# Patient Record
Sex: Female | Born: 1965 | Hispanic: No | Marital: Married | Smoking: Never smoker
Health system: Southern US, Community
[De-identification: ages and names within clinical notes are randomized; demographics above are authoritative.]

## PROBLEM LIST (undated history)

## (undated) HISTORY — PX: TONSILLECTOMY AND ADENOIDECTOMY: SHX28

---

## 1996-12-29 HISTORY — PX: NASAL SEPTUM SURGERY: SHX37

## 1998-12-06 ENCOUNTER — Other Ambulatory Visit: Admission: RE | Admit: 1998-12-06 | Discharge: 1998-12-06 | Payer: Self-pay | Admitting: Obstetrics and Gynecology

## 2000-01-01 ENCOUNTER — Other Ambulatory Visit: Admission: RE | Admit: 2000-01-01 | Discharge: 2000-01-01 | Payer: Self-pay | Admitting: Obstetrics and Gynecology

## 2001-03-08 ENCOUNTER — Other Ambulatory Visit: Admission: RE | Admit: 2001-03-08 | Discharge: 2001-03-08 | Payer: Self-pay | Admitting: Obstetrics and Gynecology

## 2002-03-15 ENCOUNTER — Other Ambulatory Visit: Admission: RE | Admit: 2002-03-15 | Discharge: 2002-03-15 | Payer: Self-pay | Admitting: *Deleted

## 2003-08-15 ENCOUNTER — Other Ambulatory Visit: Admission: RE | Admit: 2003-08-15 | Discharge: 2003-08-15 | Payer: Self-pay | Admitting: Obstetrics and Gynecology

## 2004-05-01 ENCOUNTER — Encounter: Admission: RE | Admit: 2004-05-01 | Discharge: 2004-05-01 | Payer: Self-pay | Admitting: Obstetrics and Gynecology

## 2005-08-26 ENCOUNTER — Other Ambulatory Visit: Admission: RE | Admit: 2005-08-26 | Discharge: 2005-08-26 | Payer: Self-pay | Admitting: Obstetrics and Gynecology

## 2005-09-08 ENCOUNTER — Encounter: Admission: RE | Admit: 2005-09-08 | Discharge: 2005-09-08 | Payer: Self-pay | Admitting: Obstetrics and Gynecology

## 2007-02-18 ENCOUNTER — Other Ambulatory Visit: Admission: RE | Admit: 2007-02-18 | Discharge: 2007-02-18 | Payer: Self-pay | Admitting: Obstetrics & Gynecology

## 2008-02-21 ENCOUNTER — Other Ambulatory Visit: Admission: RE | Admit: 2008-02-21 | Discharge: 2008-02-21 | Payer: Self-pay | Admitting: Obstetrics & Gynecology

## 2009-02-22 ENCOUNTER — Other Ambulatory Visit: Admission: RE | Admit: 2009-02-22 | Discharge: 2009-02-22 | Payer: Self-pay | Admitting: Obstetrics and Gynecology

## 2011-06-13 ENCOUNTER — Other Ambulatory Visit: Payer: Self-pay | Admitting: Family Medicine

## 2011-06-13 ENCOUNTER — Ambulatory Visit
Admission: RE | Admit: 2011-06-13 | Discharge: 2011-06-13 | Disposition: A | Discharge status: Home / Self Care | Payer: BC Managed Care – PPO | Source: Ambulatory Visit | Attending: Family Medicine | Admitting: Family Medicine

## 2011-06-13 DIAGNOSIS — S139XXA Sprain of joints and ligaments of unspecified parts of neck, initial encounter: Secondary | ICD-10-CM

## 2013-11-16 ENCOUNTER — Ambulatory Visit
Admission: RE | Admit: 2013-11-16 | Discharge: 2013-11-16 | Disposition: A | Discharge status: Home / Self Care | Payer: BC Managed Care – PPO | Source: Ambulatory Visit | Attending: Family Medicine | Admitting: Family Medicine

## 2013-11-16 ENCOUNTER — Other Ambulatory Visit: Payer: Self-pay | Admitting: Family Medicine

## 2013-11-16 DIAGNOSIS — M79609 Pain in unspecified limb: Secondary | ICD-10-CM

## 2013-12-19 ENCOUNTER — Ambulatory Visit: Payer: Self-pay | Admitting: Podiatry

## 2014-01-09 ENCOUNTER — Ambulatory Visit: Payer: Self-pay | Admitting: Podiatry

## 2014-01-12 ENCOUNTER — Ambulatory Visit: Payer: Self-pay | Admitting: Podiatry

## 2014-03-02 ENCOUNTER — Ambulatory Visit: Payer: Self-pay | Admitting: Nurse Practitioner

## 2015-02-06 ENCOUNTER — Encounter: Payer: Self-pay | Admitting: Nurse Practitioner

## 2015-02-06 ENCOUNTER — Ambulatory Visit (INDEPENDENT_AMBULATORY_CARE_PROVIDER_SITE_OTHER): Payer: BLUE CROSS/BLUE SHIELD | Admitting: Nurse Practitioner

## 2015-02-06 VITALS — BP 114/76 | HR 80 | Ht 63.0 in | Wt 162.0 lb

## 2015-02-06 DIAGNOSIS — Z0000 Encounter for general adult medical examination without abnormal findings: Secondary | ICD-10-CM

## 2015-02-06 DIAGNOSIS — N912 Amenorrhea, unspecified: Secondary | ICD-10-CM

## 2015-02-06 DIAGNOSIS — Z01419 Encounter for gynecological examination (general) (routine) without abnormal findings: Secondary | ICD-10-CM

## 2015-02-06 LAB — LIPID PANEL
CHOL/HDL RATIO: 2.7 ratio
CHOLESTEROL: 179 mg/dL (ref 0–200)
HDL: 66 mg/dL (ref >39)
LDL Cholesterol: 99 mg/dL (ref 0–99)
Triglycerides: 70 mg/dL (ref <150)
VLDL: 14 mg/dL (ref 0–40)

## 2015-02-06 LAB — POCT URINALYSIS DIPSTICK
BILIRUBIN UA: NEGATIVE
Blood, UA: NEGATIVE
Glucose, UA: NEGATIVE
KETONES UA: NEGATIVE
LEUKOCYTES UA: NEGATIVE
Nitrite, UA: NEGATIVE
Protein, UA: NEGATIVE
Urobilinogen, UA: NEGATIVE
pH, UA: 7

## 2015-02-06 LAB — COMPREHENSIVE METABOLIC PANEL
ALT: 44 U/L — AB (ref 0–35)
AST: 31 U/L (ref 0–37)
Albumin: 4.4 g/dL (ref 3.5–5.2)
Alkaline Phosphatase: 106 U/L (ref 39–117)
BILIRUBIN TOTAL: 0.4 mg/dL (ref 0.2–1.2)
BUN: 10 mg/dL (ref 6–23)
CO2: 26 mEq/L (ref 19–32)
CREATININE: 0.65 mg/dL (ref 0.50–1.10)
Calcium: 9.6 mg/dL (ref 8.4–10.5)
Chloride: 105 mEq/L (ref 96–112)
Glucose, Bld: 96 mg/dL (ref 70–99)
Potassium: 4.1 mEq/L (ref 3.5–5.3)
SODIUM: 137 meq/L (ref 135–145)
Total Protein: 7.1 g/dL (ref 6.0–8.3)

## 2015-02-06 LAB — HEMOGLOBIN A1C
HEMOGLOBIN A1C: 5.7 % — AB (ref <5.7)
Mean Plasma Glucose: 117 mg/dL — ABNORMAL HIGH (ref <117)

## 2015-02-06 LAB — HEMOGLOBIN, FINGERSTICK: Hemoglobin, fingerstick: 13.8 g/dL (ref 12.0–16.0)

## 2015-02-06 MED ORDER — MEDROXYPROGESTERONE ACETATE 10 MG PO TABS: 10.0000 mg | ORAL_TABLET | Freq: Every day | ORAL | Status: DC

## 2015-02-06 NOTE — Progress Notes (Signed)
Patient ID: Michele Walsh, female   DOB: 02/10/1966, 49 y.o.   MRN: [id number] 49 y.o. G0 Married  Caucasian Fe here for annual exam. Amenorrhea since 08/26/14.  States she had 2 cycles in August 2015 about 20 days apart.  Each lasted 3 days flow was ? Moderate.  Since then some breast tenderness.  No vaginal dryness.  Some decrease in libido.   Prior to that it had been 4 months since prior menses.  She has had history of irregular menses in 2012 and took Provera challenge and did return to somewhat regular menses until now.    Increase in vaso symptoms that have been worse in past 6 months. Biggest problem has been with Insomnia with night sweats. Mood changes, 'out of control'.  She is glad not to be having a cycle.  She is interested in HRT - but prefers bio -identical HRT.  Patient's last menstrual period was 08/26/2014 (exact date).          Sexually active: Yes.    The current method of family planning is rhythm method.    Exercising: No.  The patient does not participate in regular exercise at present. Smoker:  no  Health Maintenance: Pap:  02/24/13, negative with neg HR HPV, no history of abnormal MMG:  02/05/13, Bi-Rads 1:  Negative  TDaP:  UTD with PCP Labs:  HB:  13.8  Urine:  Negative    reports that she has never smoked. She has never used smokeless tobacco. She reports that she does not drink alcohol or use illicit drugs.  No past medical history on file.  Past Surgical History  Procedure Laterality Date  . Tonsillectomy and adenoidectomy  childhood  . Nasal septum surgery  1998    Current Outpatient Prescriptions  Medication Sig Dispense Refill  . medroxyPROGESTERone (PROVERA) 10 MG tablet Take 1 tablet (10 mg total) by mouth daily. 10 tablet 0   No current facility-administered medications for this visit.    Family History  Problem Relation Age of Onset  . Hyperlipidemia Mother   . Hypertension Mother   . Heart disease Mother   . Hypertension Father   . Hyperlipidemia  Father   . Heart disease Father   . Hypertension Sister   . Heart disease Maternal Grandmother   . Heart attack Maternal Grandmother   . Alzheimer's disease Paternal Grandmother   . Heart disease Paternal Grandfather   . Other Sister     blood disorder that will eventually turn into lupus    ROS:  Pertinent items are noted in HPI.  Otherwise, a comprehensive ROS was negative.  Exam:   BP 114/76 mmHg  Pulse 80  Ht 5\' 3"  (1.6 m)  Wt 162 lb (73.483 kg)  BMI 28.70 kg/m2  LMP 08/26/2014 (Exact Date) Height: 5\' 3"  (160 cm) Ht Readings from Last 3 Encounters:  02/06/15 5\' 3"  (1.6 m)    General appearance: alert, cooperative and appears stated age Head: Normocephalic, without obvious abnormality, atraumatic Neck: no adenopathy, supple, symmetrical, trachea midline and thyroid normal to inspection and palpation Lungs: clear to auscultation bilaterally Breasts: normal appearance, no masses or tenderness Heart: regular rate and rhythm Abdomen: soft, non-tender; no masses,  no organomegaly Extremities: extremities normal, atraumatic, no cyanosis or edema Skin: Skin color, texture, turgor normal. No rashes or lesions Lymph nodes: Cervical, supraclavicular, and axillary nodes normal. No abnormal inguinal nodes palpated Neurologic: Grossly normal   Pelvic: External genitalia:  no lesions  Urethra:  normal appearing urethra with no masses, tenderness or lesions              Bartholin's and Skene's: normal                 Vagina: normal appearing vagina with normal color and discharge, no lesions              Cervix: anteverted              Pap taken: Yes.   Bimanual Exam:  Uterus:  normal size, contour, position, consistency, mobility, non-tender              Adnexa: no mass, fullness, tenderness               Rectovaginal: Confirms               Anus:  normal sphincter tone, no lesions  Chaperone present:  yes  A:  Well Woman with normal exam  Perimenopausal C/W  irregular menses  Amenorrhea since 07/2014  Increase in vaso symptoms  P:   Reviewed health and wellness pertinent to exam  Pap smear taken today  Mammogram is due now and will schedule  Discussion about amenorrhea and hormonal changes  She agrees to Provera challenge again 10 mg X 10 days and will CB with +/- results  Discussed briefly WHI study and +/- SE of HRT with risk factors.  She has no [facility] of breast cancer, CVA.  Following test results she may decide to seek provider for Bio - identical HRT.  Counseled on breast self exam, mammography screening, adequate intake of calcium and vitamin D, diet and exercise return annually or prn  An After Visit Summary was printed and given to the patient.

## 2015-02-06 NOTE — Patient Instructions (Addendum)

## 2015-02-07 ENCOUNTER — Other Ambulatory Visit: Payer: Self-pay | Admitting: Nurse Practitioner

## 2015-02-07 DIAGNOSIS — R899 Unspecified abnormal finding in specimens from other organs, systems and tissues: Secondary | ICD-10-CM

## 2015-02-07 LAB — FOLLICLE STIMULATING HORMONE: FSH: 87.4 m[IU]/mL

## 2015-02-07 LAB — THYROID PANEL WITH TSH
Free Thyroxine Index: 1.9 (ref 1.4–3.8)
T3 UPTAKE: 27 % (ref 22–35)
T4 TOTAL: 7.1 ug/dL (ref 4.5–12.0)
TSH: 1.967 u[IU]/mL (ref 0.350–4.500)

## 2015-02-07 LAB — PROLACTIN: PROLACTIN: 6 ng/mL

## 2015-02-08 LAB — IPS PAP TEST WITH HPV

## 2015-02-09 NOTE — Progress Notes (Signed)
Reviewed personally.  M. Suzanne Maxen Rowland, MD.  

## 2015-02-19 ENCOUNTER — Other Ambulatory Visit (INDEPENDENT_AMBULATORY_CARE_PROVIDER_SITE_OTHER): Payer: BLUE CROSS/BLUE SHIELD

## 2015-02-19 DIAGNOSIS — R899 Unspecified abnormal finding in specimens from other organs, systems and tissues: Secondary | ICD-10-CM

## 2015-02-19 LAB — HEPATIC FUNCTION PANEL
ALBUMIN: 4.4 g/dL (ref 3.5–5.2)
ALK PHOS: 104 U/L (ref 39–117)
ALT: 38 U/L — ABNORMAL HIGH (ref 0–35)
AST: 28 U/L (ref 0–37)
BILIRUBIN DIRECT: 0.1 mg/dL (ref 0.0–0.3)
BILIRUBIN INDIRECT: 0.2 mg/dL (ref 0.2–1.2)
Total Bilirubin: 0.3 mg/dL (ref 0.2–1.2)
Total Protein: 7.3 g/dL (ref 6.0–8.3)

## 2015-02-20 ENCOUNTER — Telehealth: Payer: Self-pay | Admitting: Nurse Practitioner

## 2015-02-20 NOTE — Telephone Encounter (Signed)
Michele FranklinPatricia Rolen-Grubb, FNP please review and advise results from 2/22.

## 2015-02-20 NOTE — Telephone Encounter (Signed)
Patient is calling to see if her results are in yet. °

## 2015-02-20 NOTE — Telephone Encounter (Signed)
Results were sent via mychart.

## 2016-02-11 ENCOUNTER — Ambulatory Visit: Payer: BLUE CROSS/BLUE SHIELD | Admitting: Nurse Practitioner

## 2016-02-25 ENCOUNTER — Ambulatory Visit: Payer: BLUE CROSS/BLUE SHIELD | Admitting: Nurse Practitioner

## 2016-06-23 ENCOUNTER — Encounter: Payer: Self-pay | Admitting: Nurse Practitioner

## 2016-06-23 ENCOUNTER — Ambulatory Visit (INDEPENDENT_AMBULATORY_CARE_PROVIDER_SITE_OTHER): Payer: 59 | Admitting: Nurse Practitioner

## 2016-06-23 VITALS — BP 120/74 | HR 68 | Ht 62.75 in | Wt 172.0 lb

## 2016-06-23 DIAGNOSIS — Z0000 Encounter for general adult medical examination without abnormal findings: Secondary | ICD-10-CM | POA: Diagnosis not present

## 2016-06-23 DIAGNOSIS — N950 Postmenopausal bleeding: Secondary | ICD-10-CM

## 2016-06-23 DIAGNOSIS — Z01419 Encounter for gynecological examination (general) (routine) without abnormal findings: Secondary | ICD-10-CM | POA: Diagnosis not present

## 2016-06-23 LAB — CBC
HEMATOCRIT: 41.3 % (ref 35.0–45.0)
Hemoglobin: 13.9 g/dL (ref 11.7–15.5)
MCH: 29.5 pg (ref 27.0–33.0)
MCHC: 33.7 g/dL (ref 32.0–36.0)
MCV: 87.7 fL (ref 80.0–100.0)
MPV: 10.4 fL (ref 7.5–12.5)
Platelets: 258 10*3/uL (ref 140–400)
RBC: 4.71 MIL/uL (ref 3.80–5.10)
RDW: 13.7 % (ref 11.0–15.0)
WBC: 7.4 10*3/uL (ref 3.8–10.8)

## 2016-06-23 LAB — POCT URINALYSIS DIPSTICK
BILIRUBIN UA: NEGATIVE
Blood, UA: NEGATIVE
Glucose, UA: NEGATIVE
KETONES UA: NEGATIVE
LEUKOCYTES UA: NEGATIVE
Nitrite, UA: NEGATIVE
PH UA: 5.5
Protein, UA: NEGATIVE
Urobilinogen, UA: NEGATIVE

## 2016-06-23 NOTE — Patient Instructions (Addendum)

## 2016-06-23 NOTE — Progress Notes (Signed)
Patient ID: Michele Walsh, female   DOB: 31-Oct-1966, 50 y.o.   MRN: [id number]  50 y.o. G0P0000 Married  Caucasian Fe here for annual exam.  Light spotting for 2-3 days in 11/2015.  Did not take Provera challenge in 01/2015 as directed.  She would have postmenopausal at 07/2015 but then had another menses 12/16 as noted.  [facility]  She is advised that we should proceed with PUS and possible EMB.  Patient's last menstrual period was 11/29/2015 (approximate).          Sexually active: Yes.    The current method of family planning is peri-menopausal.    Exercising: No.  The patient does not participate in regular exercise at present. Smoker:  no  Health Maintenance: Pap:02/06/15, Negative with neg HR HPV MMG: 06/21/16, no results at time of visit, [facility] TDaP: UTD with PCP HIV: discuss today Labs: HB: 14.2  Urine: Negative    reports that she has never smoked. She has never used smokeless tobacco. She reports that she does not drink alcohol or use illicit drugs.  History reviewed. No pertinent past medical history.  Past Surgical History  Procedure Laterality Date  . Tonsillectomy and adenoidectomy  childhood  . Nasal septum surgery  1998    No current outpatient prescriptions on file.   No current facility-administered medications for this visit.    Family History  Problem Relation Age of Onset  . Hyperlipidemia Mother   . Hypertension Mother   . Heart disease Mother   . Hypertension Father   . Hyperlipidemia Father   . Heart disease Father   . Diabetes Father   . Hypertension Sister   . Heart disease Maternal Grandmother   . Heart attack Maternal Grandmother   . Alzheimer's disease Paternal Grandmother   . Heart disease Paternal Grandfather   . Other Sister     blood disorder that will eventually turn into lupus    ROS:  Pertinent items are noted in HPI.  Otherwise, a comprehensive ROS was negative.  Exam:   BP 120/74 mmHg  Pulse 68  Ht 5' 2.75"  (1.594 m)  Wt 172 lb (78.019 kg)  BMI 30.71 kg/m2  LMP 11/29/2015 (Approximate) Height: 5' 2.75" (159.4 cm) Ht Readings from Last 3 Encounters:  06/23/16 5' 2.75" (1.594 m)  02/06/15 5\' 3"  (1.6 m)    General appearance: alert, cooperative and appears stated age Head: Normocephalic, without obvious abnormality, atraumatic Neck: no adenopathy, supple, symmetrical, trachea midline and thyroid normal to inspection and palpation Lungs: clear to auscultation bilaterally Breasts: normal appearance, no masses or tenderness Heart: regular rate and rhythm Abdomen: soft, non-tender; no masses,  no organomegaly Extremities: extremities normal, atraumatic, no cyanosis or edema Skin: Skin color, texture, turgor normal. No rashes or lesions Lymph nodes: Cervical, supraclavicular, and axillary nodes normal. No abnormal inguinal nodes palpated Neurologic: Grossly normal   Pelvic: External genitalia:  no lesions              Urethra:  normal appearing urethra with no masses, tenderness or lesions              Bartholin's and Skene's: normal                 Vagina: normal appearing vagina with normal color and discharge, no lesions              Cervix: anteverted  Pap taken: No. Bimanual Exam:  Uterus:  normal size, contour, position, consistency, mobility, non-tender              Adnexa: no mass, fullness, tenderness               Rectovaginal: Confirms               Anus:  normal sphincter tone, no lesions  Chaperone present: no  A:  Well Woman with normal exam  Postmenopausal with PMB 12/16 Amenorrhea since 07/2014 until 11/2015 Increase in vaso symptoms and does not want HRT    P:   Reviewed health and wellness pertinent to exam  Pap smear as above  Mammogram is due 05/2017 if this one is normal  Follow with labs  Will get PUS and follow - may need EMB  Counseled on breast self exam, mammography screening, adequate intake of calcium and vitamin D,  diet and exercise return annually or prn  An After Visit Summary was printed and given to the patient.

## 2016-06-24 LAB — COMPREHENSIVE METABOLIC PANEL
ALBUMIN: 4.3 g/dL (ref 3.6–5.1)
ALT: 30 U/L — ABNORMAL HIGH (ref 6–29)
AST: 25 U/L (ref 10–35)
Alkaline Phosphatase: 113 U/L (ref 33–115)
BUN: 10 mg/dL (ref 7–25)
CALCIUM: 9.3 mg/dL (ref 8.6–10.2)
CHLORIDE: 103 mmol/L (ref 98–110)
CO2: 26 mmol/L (ref 20–31)
Creat: 0.68 mg/dL (ref 0.50–1.10)
GLUCOSE: 93 mg/dL (ref 65–99)
Potassium: 4.2 mmol/L (ref 3.5–5.3)
SODIUM: 142 mmol/L (ref 135–146)
Total Bilirubin: 0.4 mg/dL (ref 0.2–1.2)
Total Protein: 7.1 g/dL (ref 6.1–8.1)

## 2016-06-24 LAB — LIPID PANEL
Cholesterol: 223 mg/dL — ABNORMAL HIGH (ref 125–200)
HDL: 59 mg/dL (ref >=46)
LDL CALC: 124 mg/dL (ref <130)
TRIGLYCERIDES: 202 mg/dL — AB (ref <150)
Total CHOL/HDL Ratio: 3.8 Ratio (ref <=5.0)
VLDL: 40 mg/dL — AB (ref <30)

## 2016-06-24 LAB — HIV ANTIBODY (ROUTINE TESTING W REFLEX): HIV 1&2 Ab, 4th Generation: NONREACTIVE

## 2016-06-24 LAB — VITAMIN D 25 HYDROXY (VIT D DEFICIENCY, FRACTURES): Vit D, 25-Hydroxy: 27 ng/mL — ABNORMAL LOW (ref 30–100)

## 2016-06-24 LAB — TSH: TSH: 1.53 m[IU]/L

## 2016-06-25 ENCOUNTER — Telehealth: Payer: Self-pay | Admitting: Nurse Practitioner

## 2016-06-25 NOTE — Progress Notes (Signed)
Encounter reviewed by Dr. Dalyn Becker Amundson C. Silva.  

## 2016-06-25 NOTE — Telephone Encounter (Signed)
Spoke with pt regarding benefit for ultrasound. Patient understood and agreeable. Patient advised she has had a mammogram and has been called back in to do a repeat mammogram on the right breast. Patient advised she is a "little scared and wants to figure out what's going on with that" before proceeding with scheduling the requested ultrasound.   Routing to Ria CommentPatricia Walsh for review.

## 2016-06-25 NOTE — Telephone Encounter (Signed)
I have no note about the Mammogram to answer her concerns.  But they will answer her questions on recheck and proceed to do further evaluation if needed.  Keep her PUS notes is hold a few more day.

## 2016-06-30 LAB — HEMOGLOBIN, FINGERSTICK: Hemoglobin, fingerstick: 14.2 g/dL (ref 12.0–16.0)

## 2016-07-07 ENCOUNTER — Encounter (HOSPITAL_COMMUNITY): Payer: Self-pay | Admitting: Emergency Medicine

## 2016-07-07 ENCOUNTER — Ambulatory Visit (HOSPITAL_COMMUNITY)
Admission: EM | Admit: 2016-07-07 | Discharge: 2016-07-07 | Disposition: A | Discharge status: Home / Self Care | Payer: 59 | Attending: Emergency Medicine | Admitting: Emergency Medicine

## 2016-07-07 ENCOUNTER — Encounter: Payer: Self-pay | Admitting: Nurse Practitioner

## 2016-07-07 ENCOUNTER — Ambulatory Visit (HOSPITAL_COMMUNITY): Payer: 59

## 2016-07-07 DIAGNOSIS — M62838 Other muscle spasm: Secondary | ICD-10-CM

## 2016-07-07 DIAGNOSIS — M542 Cervicalgia: Secondary | ICD-10-CM | POA: Insufficient documentation

## 2016-07-07 DIAGNOSIS — S161XXA Strain of muscle, fascia and tendon at neck level, initial encounter: Secondary | ICD-10-CM

## 2016-07-07 DIAGNOSIS — M6248 Contracture of muscle, other site: Secondary | ICD-10-CM | POA: Diagnosis not present

## 2016-07-07 DIAGNOSIS — M5412 Radiculopathy, cervical region: Secondary | ICD-10-CM | POA: Diagnosis not present

## 2016-07-07 MED ORDER — DICLOFENAC SODIUM 75 MG PO TBEC: 75.0000 mg | DELAYED_RELEASE_TABLET | Freq: Two times a day (BID) | ORAL | Status: AC

## 2016-07-07 MED ORDER — HYDROCODONE-ACETAMINOPHEN 5-325 MG PO TABS: 2.0000 | ORAL_TABLET | ORAL | Status: AC | PRN

## 2016-07-07 MED ORDER — METAXALONE 800 MG PO TABS: 800.0000 mg | ORAL_TABLET | Freq: Three times a day (TID) | ORAL | Status: AC

## 2016-07-07 NOTE — Discharge Instructions (Signed)
You can take off the c-collar. You may wear it as needed for comfort.

## 2016-07-07 NOTE — ED Notes (Signed)
Sent for xray at hospital
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
## 2016-07-22 NOTE — Telephone Encounter (Signed)
Called patient to review benefits for a recommended procedure. Left Voicemail requesting a call back. °

## 2016-07-25 ENCOUNTER — Telehealth: Payer: Self-pay | Admitting: Nurse Practitioner

## 2016-07-25 DIAGNOSIS — N950 Postmenopausal bleeding: Secondary | ICD-10-CM

## 2016-07-25 NOTE — Telephone Encounter (Signed)
Called patient to follow up in regards to scheduling requested ultrasound. Left message on voicemail requesting a return call

## 2016-08-01 NOTE — Telephone Encounter (Signed)
Call to patient regarding pelvic ultrasound and endometrial biopsy. Left message to call back.

## 2016-08-14 NOTE — Telephone Encounter (Signed)
Patient is returning a call to Sally. °

## 2016-08-14 NOTE — Telephone Encounter (Signed)
Return call from patient. Advised of recommendation from Dr Edward JollySilva. Patient states she just had on time episode of very light spotting and it has not happened again. Advised that all bleeding after menopause warrants evaluation, amount does not reflect degree of abnormality. Brief discussion of pelvic ultrasound and possible endometrial biopsy depending on results of ultrasound. Appointment scheduled for 08-21-16 at 9:30 and instructed to take Motrin prior to procedure with food.  Routing to provider for final review. Patient agreeable to disposition. Will close encounter.    CC: Michele GoltzPatty Walsh

## 2016-08-14 NOTE — Telephone Encounter (Signed)
Call to patient. VM confirms patient's name. Per ROI can leave detailed message on [phone]. Left message calling for important follow-up of PMB and after review, Patty and Dr Edward JollySilva recommend additional evaluation. Left message to call back for additional instruction and scheduling.

## 2016-08-18 ENCOUNTER — Telehealth: Payer: Self-pay | Admitting: Nurse Practitioner

## 2016-08-18 NOTE — Telephone Encounter (Signed)
Spoke with patient. Patient canceled her PUS, consult, and possible EMB appointment with Dr.SIlva. Reports she is short staffed at work and cannot reschedule at this time. Reports she will have to return call at a later date to reschedule.  Routing to provider for final review. Patient agreeable to disposition. Will close encounter.

## 2016-08-18 NOTE — Telephone Encounter (Signed)
Patient to call back to reschedule

## 2016-08-18 NOTE — Telephone Encounter (Signed)
I would recommend an office visit with me to review her vaginal bleeding and do blood work to confirm her menopausal status.  Recommendations regarding pelvic ultrasound and EMB to follow.

## 2016-08-19 NOTE — Telephone Encounter (Signed)
Left message to call Kaitlyn at [phone]. 

## 2016-08-21 ENCOUNTER — Other Ambulatory Visit: Payer: 59

## 2016-08-21 ENCOUNTER — Other Ambulatory Visit: Payer: 59 | Admitting: Obstetrics and Gynecology

## 2016-08-22 NOTE — Telephone Encounter (Signed)
Left message to call Kaitlyn at [phone]. 

## 2016-08-28 NOTE — Telephone Encounter (Signed)
Dr.Miller, okay to send letter to patient with recommendations and close encounter?

## 2016-08-29 NOTE — Telephone Encounter (Signed)
Forwarding to Dr. Edward JollySilva for review as I am not sure how she would like communication to be with the patient.

## 2016-09-02 NOTE — Telephone Encounter (Signed)
I recommend sending a letter to the patient explaining the importance of evaluation of postmenopausal bleeding to rule out etiologies including malignancy.   The evaluation is best performed with pelvic ultrasound and possible endometrial biopsy if warranted based on the ultrasound.   This is not a 30 day letter just a really clear explanation.

## 2016-09-04 ENCOUNTER — Encounter: Payer: Self-pay | Admitting: *Deleted

## 2016-09-04 NOTE — Telephone Encounter (Signed)
Letter to your office for review. 

## 2016-09-05 NOTE — Telephone Encounter (Signed)
Letter mailed certified and regular [place] mail. 

## 2016-09-05 NOTE — Telephone Encounter (Signed)
Letter signed by me and placed on your desk. 
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
## 2017-06-29 ENCOUNTER — Ambulatory Visit: Payer: 59 | Admitting: Nurse Practitioner

## 2017-06-29 NOTE — Progress Notes (Deleted)
51 y.o. G0P0000 Married  Caucasian Fe here for annual exam.    No LMP recorded. Patient is not currently having periods (Reason: Perimenopausal).          Sexually active: {yes no:314532}  The current method of family planning is {contraception:315051}.    Exercising: {yes no:314532}  {types:19826} Smoker:  {YES NO:22349}  Health Maintenance: Pap:  02-06-15 neg HPV HR neg History of Abnormal Pap: {YES NO:22349} MMG:  Bilateral done ***(waiting for fax)     06-26-16 rt breast category c density birads 2:neg Self Breast exams: {YES NO:22349} Colonoscopy:  none BMD:   none TDaP:  UTD with PCP Shingles: no Pneumonia: no Hep C and HIV: HIV neg 2017 Labs: ***   reports that she has never smoked. She has never used smokeless tobacco. She reports that she does not drink alcohol or use drugs.  No past medical history on file.  Past Surgical History:  Procedure Laterality Date  . NASAL SEPTUM SURGERY  1998  . TONSILLECTOMY AND ADENOIDECTOMY  childhood    Current Outpatient Prescriptions  Medication Sig Dispense Refill  . diclofenac (VOLTAREN) 75 MG EC tablet Take 1 tablet (75 mg total) by mouth 2 (two) times daily. Take with food 30 tablet 0  . HYDROcodone-acetaminophen (NORCO/VICODIN) 5-325 MG tablet Take 2 tablets by mouth every 4 (four) hours as needed for moderate pain. 20 tablet 0  . metaxalone (SKELAXIN) 800 MG tablet Take 1 tablet (800 mg total) by mouth 3 (three) times daily. 21 tablet 0   No current facility-administered medications for this visit.     Family History  Problem Relation Age of Onset  . Hyperlipidemia Mother   . Hypertension Mother   . Heart disease Mother   . Hypertension Father   . Hyperlipidemia Father   . Heart disease Father   . Diabetes Father   . Hypertension Sister   . Heart disease Maternal Grandmother   . Heart attack Maternal Grandmother   . Alzheimer's disease Paternal Grandmother   . Heart disease Paternal Grandfather   . Other Sister       blood disorder that will eventually turn into lupus    ROS:  Pertinent items are noted in HPI.  Otherwise, a comprehensive ROS was negative.  Exam:   There were no vitals taken for this visit.   Ht Readings from Last 3 Encounters:  06/23/16 5' 2.75" (1.594 m)  02/06/15 5\' 3"  (1.6 m)    General appearance: alert, cooperative and appears stated age Head: Normocephalic, without obvious abnormality, atraumatic Neck: no adenopathy, supple, symmetrical, trachea midline and thyroid {EXAM; THYROID:18604} Lungs: clear to auscultation bilaterally Breasts: {Exam; breast:13139::"normal appearance, no masses or tenderness"} Heart: regular rate and rhythm Abdomen: soft, non-tender; no masses,  no organomegaly Extremities: extremities normal, atraumatic, no cyanosis or edema Skin: Skin color, texture, turgor normal. No rashes or lesions Lymph nodes: Cervical, supraclavicular, and axillary nodes normal. No abnormal inguinal nodes palpated Neurologic: Grossly normal   Pelvic: External genitalia:  no lesions              Urethra:  normal appearing urethra with no masses, tenderness or lesions              Bartholin's and Skene's: normal                 Vagina: normal appearing vagina with normal color and discharge, no lesions              Cervix: {exam; cervix:14595}  Pap taken: {yes no:314532} Bimanual Exam:  Uterus:  {exam; uterus:12215}              Adnexa: {exam; adnexa:12223}               Rectovaginal: Confirms               Anus:  normal sphincter tone, no lesions  Chaperone present: ***  A:  Well Woman with normal exam  P:   Reviewed health and wellness pertinent to exam  Pap smear: {YES NO:22349}  {plan; gyn:5269::"mammogram","pap smear","return annually or prn"}  An After Visit Summary was printed and given to the patient.

## 2017-10-23 ENCOUNTER — Encounter: Payer: Self-pay | Admitting: Nurse Practitioner
